# Patient Record
Sex: Female | Born: 1960 | Race: White | Hispanic: No | Marital: Married
Health system: Southern US, Community
[De-identification: ages and names within clinical notes are randomized; demographics above are authoritative.]

## PROBLEM LIST (undated history)

## (undated) DIAGNOSIS — F32A Depression, unspecified: Secondary | ICD-10-CM

## (undated) DIAGNOSIS — E039 Hypothyroidism, unspecified: Secondary | ICD-10-CM

## (undated) DIAGNOSIS — F329 Major depressive disorder, single episode, unspecified: Secondary | ICD-10-CM

---

## 2011-02-19 ENCOUNTER — Emergency Department (HOSPITAL_COMMUNITY)
Admission: EM | Admit: 2011-02-19 | Discharge: 2011-02-19 | Disposition: A | Discharge status: Home / Self Care | Payer: 59 | Attending: Emergency Medicine | Admitting: Emergency Medicine

## 2011-02-19 ENCOUNTER — Emergency Department (HOSPITAL_COMMUNITY): Payer: 59

## 2011-02-19 DIAGNOSIS — F329 Major depressive disorder, single episode, unspecified: Secondary | ICD-10-CM | POA: Insufficient documentation

## 2011-02-19 DIAGNOSIS — E039 Hypothyroidism, unspecified: Secondary | ICD-10-CM | POA: Insufficient documentation

## 2011-02-19 DIAGNOSIS — Z79899 Other long term (current) drug therapy: Secondary | ICD-10-CM | POA: Insufficient documentation

## 2011-02-19 DIAGNOSIS — J40 Bronchitis, not specified as acute or chronic: Secondary | ICD-10-CM | POA: Insufficient documentation

## 2011-02-19 DIAGNOSIS — F3289 Other specified depressive episodes: Secondary | ICD-10-CM | POA: Insufficient documentation

## 2011-02-19 HISTORY — DX: Major depressive disorder, single episode, unspecified: F32.9

## 2011-02-19 HISTORY — DX: Hypothyroidism, unspecified: E03.9

## 2011-02-19 HISTORY — DX: Depression, unspecified: F32.A

## 2011-02-19 MED ORDER — ALBUTEROL SULFATE HFA 108 (90 BASE) MCG/ACT IN AERS: 2.0000 | INHALATION_SPRAY | RESPIRATORY_TRACT | Status: AC | PRN

## 2011-02-19 MED ORDER — METHYLPREDNISOLONE 4 MG PO KIT: PACK | ORAL | Status: AC

## 2011-02-19 MED ORDER — IPRATROPIUM BROMIDE 0.02 % IN SOLN
0.5000 mg | Freq: Once | RESPIRATORY_TRACT | Status: AC
Start: 2011-02-19 — End: 2011-02-19
  Administered 2011-02-19: 0.5 mg via RESPIRATORY_TRACT
  Filled 2011-02-19: qty 2.5

## 2011-02-19 MED ORDER — GUAIFENESIN 100 MG/5ML PO LIQD: 100.0000 mg | ORAL | Status: AC | PRN

## 2011-02-19 MED ORDER — ALBUTEROL SULFATE (5 MG/ML) 0.5% IN NEBU
5.0000 mg | INHALATION_SOLUTION | Freq: Once | RESPIRATORY_TRACT | Status: AC
  Administered 2011-02-19: 5 mg via RESPIRATORY_TRACT
  Filled 2011-02-19: qty 1

## 2011-02-19 MED ORDER — AZITHROMYCIN 250 MG PO TABS: 250.0000 mg | ORAL_TABLET | Freq: Every day | ORAL | Status: AC

## 2011-02-19 NOTE — ED Notes (Signed)
Patient reports ongoing cough x 1 month-reports productive-yellow in color, no respiratory distress.

## 2011-02-19 NOTE — ED Provider Notes (Signed)
Medical screening examination/treatment/procedure(s) were performed by non-physician practitioner and as supervising physician I was immediately available for consultation/collaboration.  Doug Sou, MD 02/19/11 [phone]

## 2011-02-19 NOTE — ED Provider Notes (Signed)
History     [name]: [id number] Arrival date & time: 02/19/2011 10:53 AM   First MD Initiated Contact with Patient 02/19/11 1153      No chief complaint on file.   (Consider location/radiation/quality/duration/timing/severity/associated sxs/prior treatment) Patient is a 50 y.o. female presenting with cough. The history is provided by the patient.  Cough This is a new problem. The current episode started more than 1 week ago. The problem occurs constantly. The problem has not changed since onset.The cough is productive of sputum. There has been no fever. Pertinent negatives include no chest pain, no chills, no sweats, no weight loss, no ear congestion, no ear pain, no headaches, no rhinorrhea, no sore throat, no myalgias, no shortness of breath, no wheezing and no eye redness. She is not a smoker. Her past medical history does not include bronchitis, pneumonia, bronchiectasis, COPD, emphysema or asthma.   Patient presents with cough that started one month ago. She states this is productive in nature. She's been afebrile at home and denies chills. She denies abdominal pain, nausea, vomiting, diarrhea, leg swelling, chest pain. She's not had any shortness of breath. She does not have past medical history of asthma, chronic bronchitis, emphysema. She saw her primary care doctor in [place] for this and was prescribed a 2 week course of doxycycline. She states that she took about 10 days worth of this, but it did not help very much. She's not been taking any over-the-counter cough or cold medications. She states that she has felt especially poorly over the past 4 days, which prompted her visit today.  Past Medical History  Diagnosis Date  . Depression   . Hypothyroid     No past surgical history on file.  No family history on file.  History  Substance Use Topics  . Smoking status: Not on file  . Smokeless tobacco: Not on file  . Alcohol Use:     OB History    Grav Para Term Preterm  Abortions TAB SAB Ect Mult Living                  Review of Systems  Constitutional: Negative for fever, chills, weight loss, activity change, appetite change, fatigue and unexpected weight change.  HENT: Negative for ear pain, sore throat, facial swelling, rhinorrhea, neck pain and neck stiffness.   Eyes: Negative for redness.  Respiratory: Positive for cough. Negative for shortness of breath and wheezing.   Cardiovascular: Negative for chest pain.  Gastrointestinal: Negative.   Genitourinary: Negative.   Musculoskeletal: Negative for myalgias.  Neurological: Negative for dizziness, light-headedness and headaches.    Allergies  Review of patient's allergies indicates no known allergies.  Home Medications   Current Outpatient Rx  Name Route Sig Dispense Refill  . ALPRAZOLAM 1 MG PO TABS Oral Take 1 mg by mouth 3 (three) times daily as needed. For anxiety.     Marland Kitchen DIVALPROEX SODIUM 250 MG PO TBEC Oral Take 250 mg by mouth 3 (three) times daily.      Marland Kitchen DOXYCYCLINE HYCLATE 100 MG PO TABS Oral Take 100 mg by mouth 2 (two) times daily. 14 day course of therapy, finished.     . IBUPROFEN 200 MG PO TABS Oral Take 400-800 mg by mouth every 8 (eight) hours as needed. For pain.     Marland Kitchen LEVOTHYROXINE SODIUM 112 MCG PO TABS Oral Take 112 mcg by mouth daily.      . RED YEAST RICE PO Oral Take 2 tablets by mouth 2 (  two) times daily.      . SUMATRIPTAN SUCCINATE 50 MG PO TABS Oral Take 50 mg by mouth every 2 (two) hours as needed. For migraine.     Marland Kitchen VILAZODONE HCL 40 MG PO TABS Oral Take 1 tablet by mouth daily.        BP 127/76  Pulse 92  Temp(Src) 98.5 F (36.9 C) (Oral)  Resp 18  SpO2 96%  Physical Exam  Nursing note and vitals reviewed. Constitutional: She is oriented to person, place, and time. She appears well-developed and well-nourished. No distress.  HENT:  Head: Normocephalic and atraumatic.  Right Ear: External ear normal.  Left Ear: External ear normal.  Eyes: Conjunctivae  are normal. Pupils are equal, round, and reactive to light.  Neck: Normal range of motion.  Cardiovascular: Normal rate, regular rhythm and normal heart sounds.   Pulmonary/Chest: Effort normal. No respiratory distress. She has wheezes. She has no rales. She exhibits no tenderness.       Mild expiratory wheezing diffusely  Abdominal: Soft. Bowel sounds are normal. There is no tenderness.  Musculoskeletal: Normal range of motion. She exhibits no edema.  Neurological: She is alert and oriented to person, place, and time. No cranial nerve deficit.  Skin: Skin is warm and dry. No rash noted. She is not diaphoretic.  Psychiatric: She has a normal mood and affect.    ED Course  Procedures (including critical care time)  Labs Reviewed - No data to display Dg Chest 2 View  02/19/2011  *RADIOLOGY REPORT*  Clinical Data: Chest pain and cough for 1 month.  CHEST - 2 VIEW  Comparison: None.  Findings: Two views of the chest demonstrate clear lungs. Heart and mediastinum are within normal limits.  The trachea is midline. Bony structures are intact.  IMPRESSION: Normal chest examination.  Original Report Authenticated By: Richarda Overlie, M.D.     1. Bronchitis       MDM  Xray reviewed by myself and appears to be unremarkable. No sign of PNA. I think this is likely viral despite the patient having sx for a month. Will treat with Medrol dose-pack and albuterol inhaler. Patient was instructed to return for worsening SOB, fever, or other worrisome symptoms. She verbalized understanding and agreed to plan.        Grant Fontana, [place] 02/19/11 1541

## 2013-01-15 IMAGING — CR DG CHEST 2V
2 series · 2 of 2 positions shown · non-contrast
Comparison: None.

CLINICAL DATA: Chest pain and cough for 1 month.

CHEST - 2 VIEW

[w chest pa]
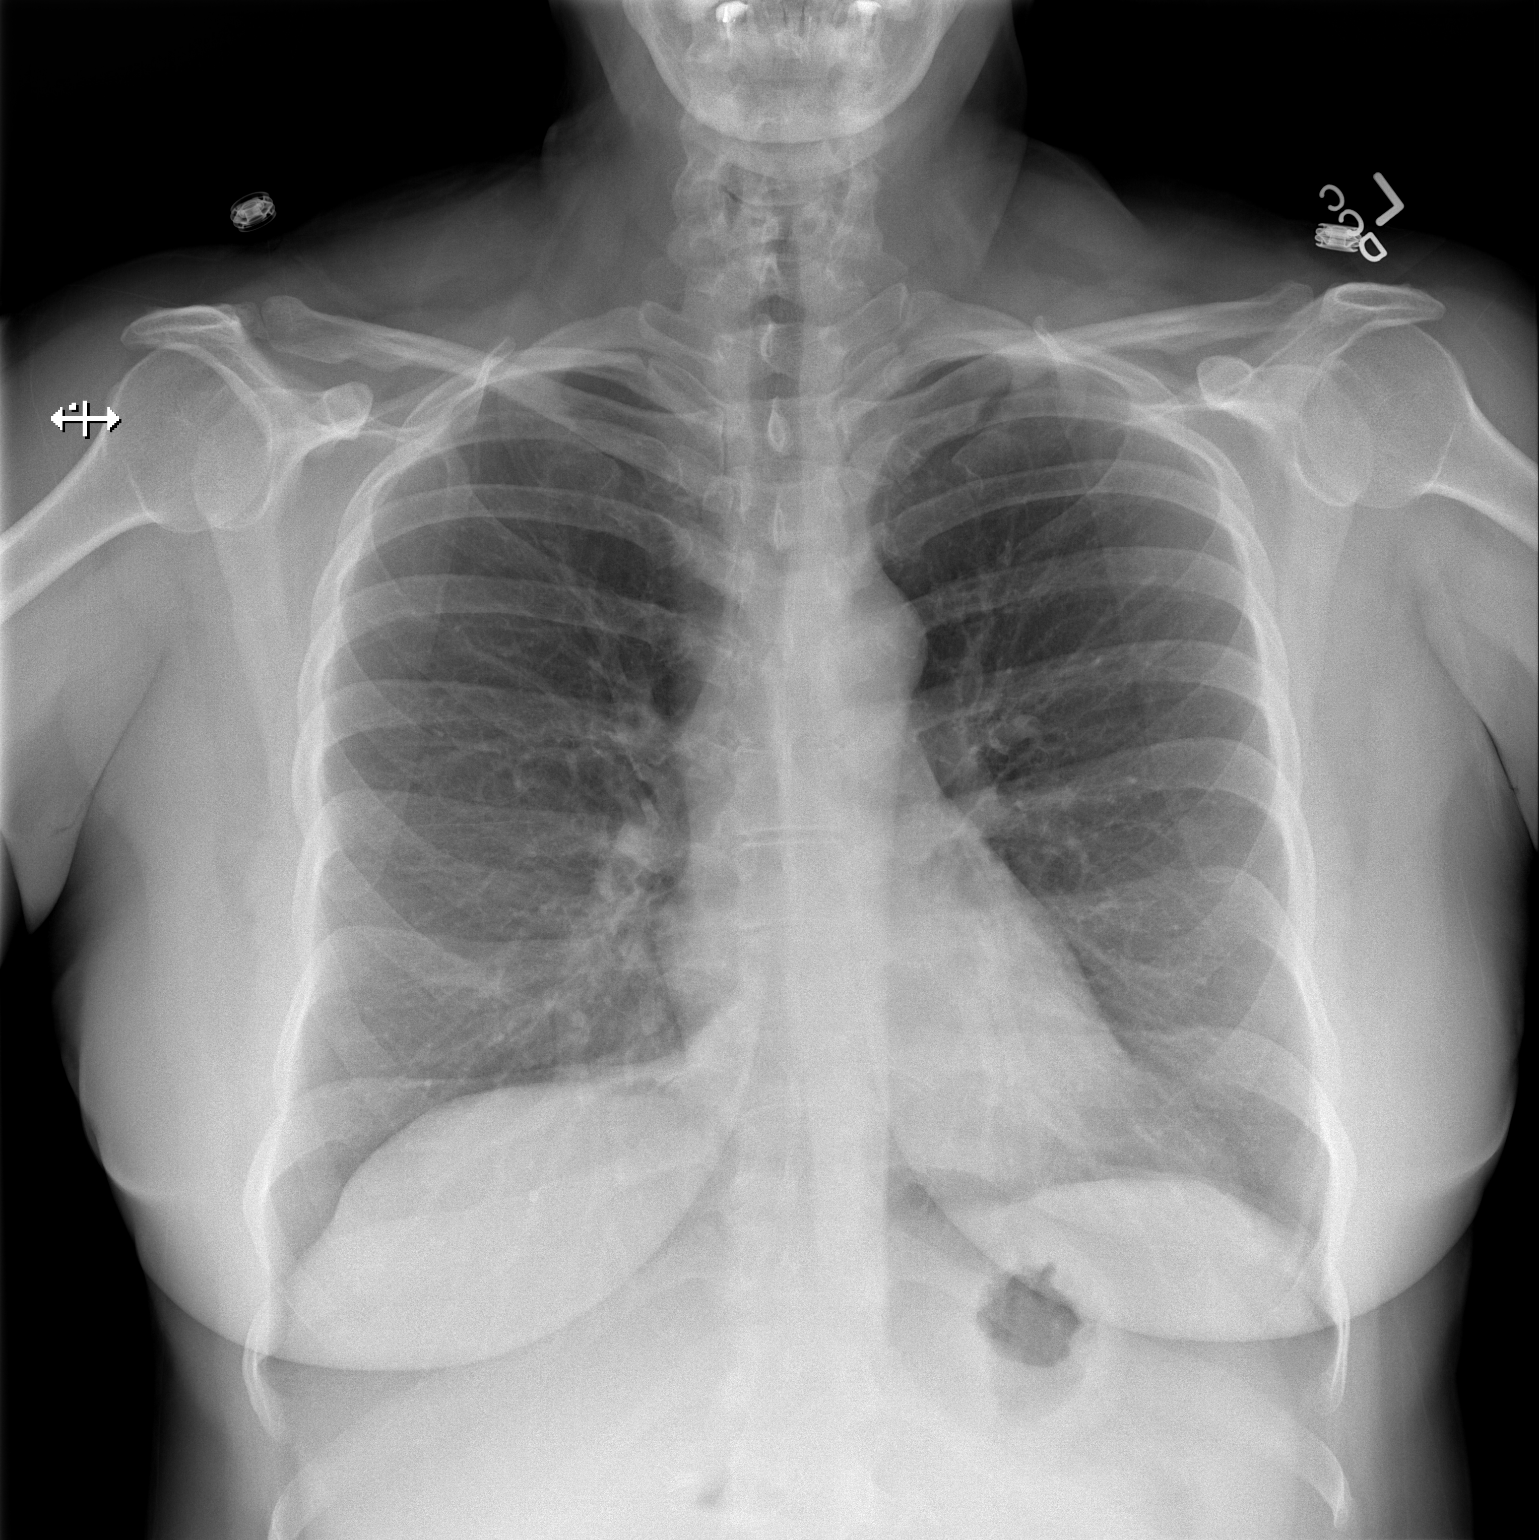

[w chest lat]
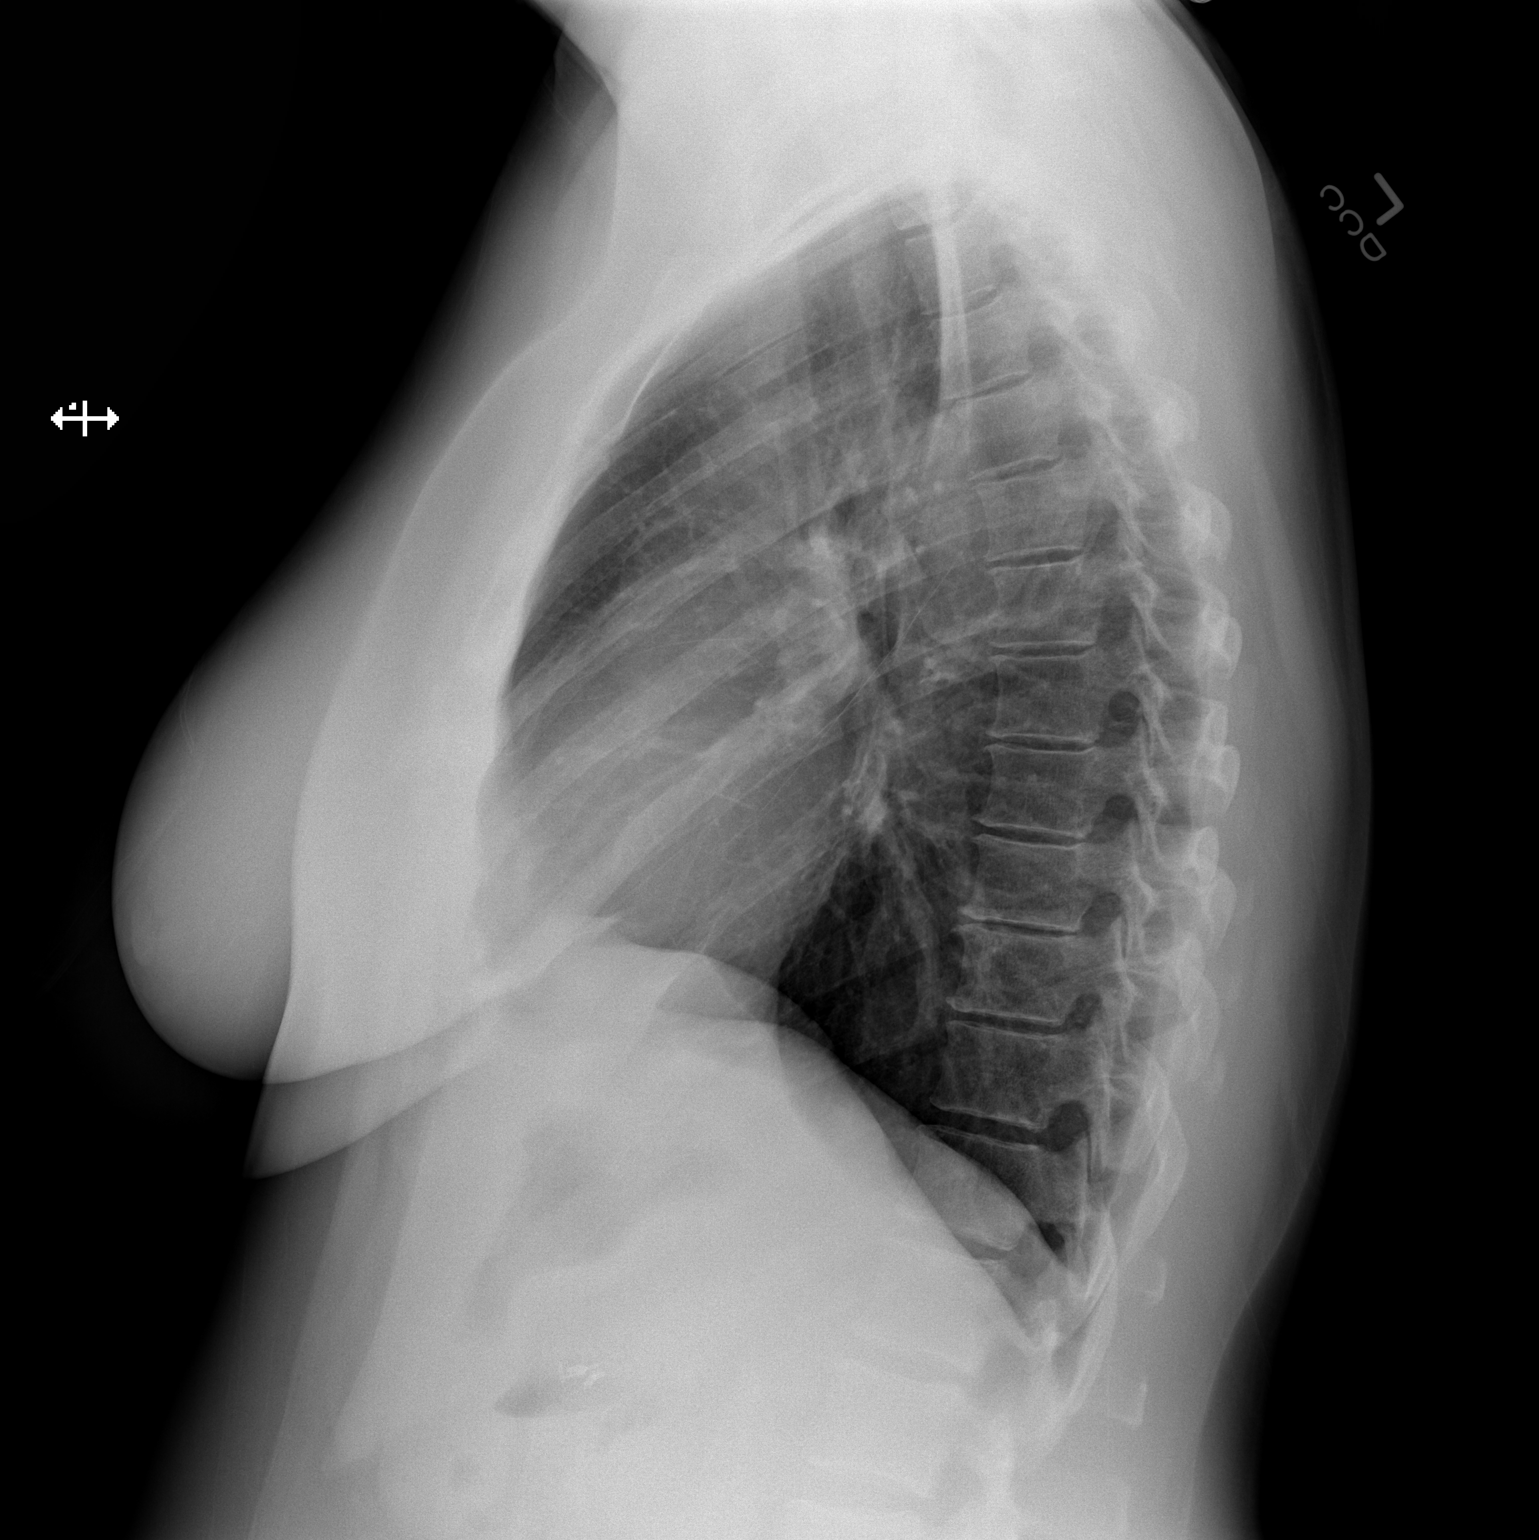

[2 of 2 positions shown; findings below may reference images not displayed]

FINDINGS: Two views of the chest demonstrate clear lungs. Heart and
mediastinum are within normal limits.  The trachea is midline.
Bony structures are intact.
IMPRESSION: Normal chest examination.

## 2017-04-23 DIAGNOSIS — Z789 Other specified health status: Secondary | ICD-10-CM | POA: Diagnosis not present

## 2017-04-23 DIAGNOSIS — I129 Hypertensive chronic kidney disease with stage 1 through stage 4 chronic kidney disease, or unspecified chronic kidney disease: Secondary | ICD-10-CM | POA: Diagnosis not present

## 2017-04-23 DIAGNOSIS — E7800 Pure hypercholesterolemia, unspecified: Secondary | ICD-10-CM | POA: Diagnosis not present

## 2017-04-23 DIAGNOSIS — E039 Hypothyroidism, unspecified: Secondary | ICD-10-CM | POA: Diagnosis not present

## 2017-04-23 DIAGNOSIS — N182 Chronic kidney disease, stage 2 (mild): Secondary | ICD-10-CM | POA: Diagnosis not present

## 2017-10-20 DIAGNOSIS — E039 Hypothyroidism, unspecified: Secondary | ICD-10-CM | POA: Diagnosis not present

## 2017-10-20 DIAGNOSIS — I10 Essential (primary) hypertension: Secondary | ICD-10-CM | POA: Diagnosis not present

## 2017-10-20 DIAGNOSIS — F3341 Major depressive disorder, recurrent, in partial remission: Secondary | ICD-10-CM | POA: Diagnosis not present

## 2017-10-20 DIAGNOSIS — F411 Generalized anxiety disorder: Secondary | ICD-10-CM | POA: Diagnosis not present

## 2017-10-20 DIAGNOSIS — E7800 Pure hypercholesterolemia, unspecified: Secondary | ICD-10-CM | POA: Diagnosis not present

## 2018-05-30 DIAGNOSIS — J039 Acute tonsillitis, unspecified: Secondary | ICD-10-CM | POA: Diagnosis not present

## 2018-06-05 DIAGNOSIS — J039 Acute tonsillitis, unspecified: Secondary | ICD-10-CM | POA: Diagnosis not present

## 2018-08-27 DIAGNOSIS — F411 Generalized anxiety disorder: Secondary | ICD-10-CM | POA: Diagnosis not present

## 2018-11-25 DIAGNOSIS — E039 Hypothyroidism, unspecified: Secondary | ICD-10-CM | POA: Diagnosis not present

## 2018-12-24 DIAGNOSIS — N644 Mastodynia: Secondary | ICD-10-CM | POA: Diagnosis not present

## 2018-12-31 DIAGNOSIS — Z1151 Encounter for screening for human papillomavirus (HPV): Secondary | ICD-10-CM | POA: Diagnosis not present

## 2018-12-31 DIAGNOSIS — Z6832 Body mass index (BMI) 32.0-32.9, adult: Secondary | ICD-10-CM | POA: Diagnosis not present

## 2018-12-31 DIAGNOSIS — Z01419 Encounter for gynecological examination (general) (routine) without abnormal findings: Secondary | ICD-10-CM | POA: Diagnosis not present

## 2019-01-04 DIAGNOSIS — E039 Hypothyroidism, unspecified: Secondary | ICD-10-CM | POA: Diagnosis not present

## 2019-01-25 DIAGNOSIS — N841 Polyp of cervix uteri: Secondary | ICD-10-CM | POA: Diagnosis not present

## 2019-01-25 DIAGNOSIS — Z1231 Encounter for screening mammogram for malignant neoplasm of breast: Secondary | ICD-10-CM | POA: Diagnosis not present

## 2019-03-14 DIAGNOSIS — G43109 Migraine with aura, not intractable, without status migrainosus: Secondary | ICD-10-CM | POA: Diagnosis not present

## 2019-03-14 DIAGNOSIS — F3341 Major depressive disorder, recurrent, in partial remission: Secondary | ICD-10-CM | POA: Diagnosis not present

## 2019-09-12 DIAGNOSIS — E039 Hypothyroidism, unspecified: Secondary | ICD-10-CM | POA: Diagnosis not present

## 2019-09-12 DIAGNOSIS — Z6832 Body mass index (BMI) 32.0-32.9, adult: Secondary | ICD-10-CM | POA: Diagnosis not present

## 2019-09-12 DIAGNOSIS — F411 Generalized anxiety disorder: Secondary | ICD-10-CM | POA: Diagnosis not present

## 2021-12-12 ENCOUNTER — Emergency Department (HOSPITAL_BASED_OUTPATIENT_CLINIC_OR_DEPARTMENT_OTHER)
Admission: EM | Admit: 2021-12-12 | Discharge: 2021-12-12 | Disposition: A | Discharge status: Home / Self Care | Payer: BC Managed Care – PPO | Attending: Emergency Medicine | Admitting: Emergency Medicine

## 2021-12-12 ENCOUNTER — Other Ambulatory Visit: Payer: Self-pay

## 2021-12-12 ENCOUNTER — Encounter (HOSPITAL_BASED_OUTPATIENT_CLINIC_OR_DEPARTMENT_OTHER): Payer: Self-pay | Admitting: Emergency Medicine

## 2021-12-12 DIAGNOSIS — R251 Tremor, unspecified: Secondary | ICD-10-CM | POA: Diagnosis present

## 2021-12-12 DIAGNOSIS — F515 Nightmare disorder: Secondary | ICD-10-CM | POA: Insufficient documentation

## 2021-12-12 DIAGNOSIS — F419 Anxiety disorder, unspecified: Secondary | ICD-10-CM | POA: Insufficient documentation

## 2021-12-12 LAB — TSH: TSH: 1.418 u[IU]/mL (ref 0.350–4.500)

## 2021-12-12 LAB — COMPREHENSIVE METABOLIC PANEL
ALT: 23 U/L (ref 0–44)
AST: 19 U/L (ref 15–41)
Albumin: 4.2 g/dL (ref 3.5–5.0)
Alkaline Phosphatase: 61 U/L (ref 38–126)
Anion gap: 10 (ref 5–15)
BUN: 13 mg/dL (ref 8–23)
CO2: 24 mmol/L (ref 22–32)
Calcium: 10.1 mg/dL (ref 8.9–10.3)
Chloride: 102 mmol/L (ref 98–111)
Creatinine, Ser: 0.79 mg/dL (ref 0.44–1.00)
GFR, Estimated: >60 mL/min (ref >60)
Glucose, Bld: 148 mg/dL — ABNORMAL HIGH (ref 70–99)
Potassium: 3.7 mmol/L (ref 3.5–5.1)
Sodium: 136 mmol/L (ref 135–145)
Total Bilirubin: 0.9 mg/dL (ref 0.3–1.2)
Total Protein: 7.2 g/dL (ref 6.5–8.1)

## 2021-12-12 LAB — CBC WITH DIFFERENTIAL/PLATELET
Abs Immature Granulocytes: 0.04 10*3/uL (ref 0.00–0.07)
Basophils Absolute: 0 10*3/uL (ref 0.0–0.1)
Basophils Relative: 0 %
Eosinophils Absolute: 0 10*3/uL (ref 0.0–0.5)
Eosinophils Relative: 0 %
HCT: 44.3 % (ref 36.0–46.0)
Hemoglobin: 15 g/dL (ref 12.0–15.0)
Immature Granulocytes: 0 %
Lymphocytes Relative: 13 %
Lymphs Abs: 1.3 10*3/uL (ref 0.7–4.0)
MCH: 29.2 pg (ref 26.0–34.0)
MCHC: 33.9 g/dL (ref 30.0–36.0)
MCV: 86.2 fL (ref 80.0–100.0)
Monocytes Absolute: 0.7 10*3/uL (ref 0.1–1.0)
Monocytes Relative: 7 %
Neutro Abs: 7.7 10*3/uL (ref 1.7–7.7)
Neutrophils Relative %: 80 %
Platelets: 245 10*3/uL (ref 150–400)
RBC: 5.14 MIL/uL — ABNORMAL HIGH (ref 3.87–5.11)
RDW: 12.7 % (ref 11.5–15.5)
WBC: 9.8 10*3/uL (ref 4.0–10.5)
nRBC: 0 % (ref 0.0–0.2)

## 2021-12-12 LAB — URINALYSIS, ROUTINE W REFLEX MICROSCOPIC
Bilirubin Urine: NEGATIVE
Glucose, UA: NEGATIVE mg/dL
Ketones, ur: NEGATIVE mg/dL
Leukocytes,Ua: NEGATIVE
Nitrite: NEGATIVE
Protein, ur: NEGATIVE mg/dL
Specific Gravity, Urine: <=1.005 (ref 1.005–1.030)
pH: 6.5 (ref 5.0–8.0)

## 2021-12-12 LAB — URINALYSIS, MICROSCOPIC (REFLEX)

## 2021-12-12 LAB — TROPONIN I (HIGH SENSITIVITY): Troponin I (High Sensitivity): <2 ng/L (ref <18)

## 2021-12-12 MED ORDER — ALPRAZOLAM 1 MG PO TABS: 1.0000 mg | ORAL_TABLET | Freq: Three times a day (TID) | ORAL | 0 refills | Status: AC | PRN

## 2021-12-12 MED ORDER — ALPRAZOLAM 0.5 MG PO TABS
0.5000 mg | ORAL_TABLET | Freq: Once | ORAL | Status: AC
  Administered 2021-12-12: 0.5 mg via ORAL
  Filled 2021-12-12: qty 1

## 2021-12-12 NOTE — ED Triage Notes (Signed)
Saw dr on Tuesday for same shakes ,  and anxiety, given rx and tried them ,, had bad dreams  lexapro and buspar  has made things worse , no appetiti  bad dreams ,  she states meds not helpings and needs something else

## 2021-12-12 NOTE — Discharge Instructions (Addendum)
You were seen in the emergency department for increased anxiety tremulousness.  Your lab work did not show an obvious explanation for your symptoms, although your thyroid test is still pending at time of discharge.  You can follow this result up in MyChart.  We are prescribing you a short course of Xanax that may help with your symptoms.  Please follow-up with your primary care doctor as scheduled.  Return to the emergency department if any worsening or concerning symptoms.

## 2021-12-12 NOTE — ED Provider Notes (Signed)
[facility] EMERGENCY DEPARTMENT Provider Note   [name]: [id number] Arrival date & time: 12/12/21  9562     History  Chief Complaint  Patient presents with   Panic Attack    Lamiyah Taylor is a 61 y.o. female.  She has a history of depression and anxiety.  She had been off meds for about a year.  She had more depressive anxiety symptoms recently and so saw her PCP who started her back on Lexapro and started buspirone.  This was 2 days ago.  Since then she has had increased anxiety and nightmares, feeling tremulous.  She feels it might be a low blood sugar so she has been increasing her sugar intake without improvement.  She said she has not taken her Xanax in over 6 years.  She is supposed to get blood work done next week by her PCP but felt she needed to come here to check blood work to make sure there was nothing significantly wrong.  The history is provided by the patient and a friend.  Anxiety This is a recurrent problem. The current episode started 2 days ago. The problem occurs constantly. The problem has not changed since onset.Pertinent negatives include no chest pain, no abdominal pain, no headaches and no shortness of breath. Exacerbated by: Question new medication. Nothing relieves the symptoms. She has tried rest for the symptoms. The treatment provided no relief.       Home Medications Prior to Admission medications   Medication Sig Start Date End Date Taking? Authorizing Provider  albuterol (PROVENTIL HFA;VENTOLIN HFA) 108 (90 BASE) MCG/ACT inhaler Inhale 2 puffs into the lungs every 4 (four) hours as needed for wheezing. 02/19/11 02/19/12  Grant Fontana, PA-C  ALPRAZolam Prudy Feeler) 1 MG tablet Take 1 mg by mouth 3 (three) times daily as needed. For anxiety.     [provider]  divalproex (DEPAKOTE) 250 MG DR tablet Take 250 mg by mouth 3 (three) times daily.      [provider]  doxycycline (VIBRA-TABS) 100 MG tablet Take 100 mg by mouth 2  (two) times daily. 14 day course of therapy, finished.     [provider]  ibuprofen (ADVIL,MOTRIN) 200 MG tablet Take 400-800 mg by mouth every 8 (eight) hours as needed. For pain.     [provider]  levothyroxine (SYNTHROID, LEVOTHROID) 112 MCG tablet Take 112 mcg by mouth daily.      [provider]  methylPREDNISolone (MEDROL DOSEPAK) 4 MG tablet 6 PO first day, 5 second day, 4 third day, 3 fourth day, 2 fifth day, 1 sixth day 02/19/11   Grant Fontana, PA-C  Red Yeast Rice Extract (RED YEAST RICE PO) Take 2 tablets by mouth 2 (two) times daily.      [provider]  SUMAtriptan (IMITREX) 50 MG tablet Take 50 mg by mouth every 2 (two) hours as needed. For migraine.     [provider]  Vilazodone HCl (VIIBRYD) 40 MG TABS Take 1 tablet by mouth daily.      [provider]      Allergies    Patient has no known allergies.    Review of Systems   Review of Systems  Constitutional:  Negative for fever.  HENT:  Negative for sore throat.   Eyes:  Negative for visual disturbance.  Respiratory:  Negative for shortness of breath.   Cardiovascular:  Negative for chest pain.  Gastrointestinal:  Negative for abdominal pain.  Genitourinary:  Negative for dysuria.  Skin:  Negative for rash.  Neurological:  Positive for tremors. Negative for headaches.  Psychiatric/Behavioral:  Positive for sleep disturbance. The patient is nervous/anxious.     Physical Exam Updated Vital Signs BP (!) 165/93   Pulse 88   Temp 98.1 F (36.7 C) (Oral)   Resp 20   Ht 5\' 3"  (1.6 m)   Wt 74.8 kg   SpO2 100%   BMI 29.23 kg/m  Physical Exam Vitals and nursing note reviewed.  Constitutional:      General: She is not in acute distress.    Appearance: Normal appearance. She is well-developed.  HENT:     Head: Normocephalic and atraumatic.  Eyes:     Conjunctiva/sclera: Conjunctivae normal.  Neck:     Comments: No thyroid enlargement or  tenderness Cardiovascular:     Rate and Rhythm: Normal rate and regular rhythm.     Heart sounds: No murmur heard. Pulmonary:     Effort: Pulmonary effort is normal. No respiratory distress.     Breath sounds: Normal breath sounds.  Abdominal:     Palpations: Abdomen is soft.     Tenderness: There is no abdominal tenderness. There is no guarding or rebound.  Musculoskeletal:        General: No swelling.     Cervical back: Neck supple.  Skin:    General: Skin is warm and dry.     Capillary Refill: Capillary refill takes less than 2 seconds.  Neurological:     General: No focal deficit present.     Mental Status: She is alert.     Motor: No weakness.     Gait: Gait normal.     ED Results / Procedures / Treatments   Labs (all labs ordered are listed, but only abnormal results are displayed) Labs Reviewed  COMPREHENSIVE METABOLIC PANEL - Abnormal; Notable for the following components:      Result Value   Glucose, Bld 148 (*)    All other components within normal limits  CBC WITH DIFFERENTIAL/PLATELET - Abnormal; Notable for the following components:   RBC 5.14 (*)    All other components within normal limits  URINALYSIS, ROUTINE W REFLEX MICROSCOPIC - Abnormal; Notable for the following components:   Hgb urine dipstick MODERATE (*)    All other components within normal limits  URINALYSIS, MICROSCOPIC (REFLEX) - Abnormal; Notable for the following components:   Bacteria, UA RARE (*)    All other components within normal limits  TSH  TROPONIN I (HIGH SENSITIVITY)    EKG EKG Interpretation  Date/Time:  Thursday December 12 2021 07:13:53 EDT Ventricular Rate:  79 PR Interval:  176 QRS Duration: 86 QT Interval:  365 QTC Calculation: 419 R Axis:   35 Text Interpretation: Sinus rhythm Low voltage, precordial leads RSR' in V1 or V2, right VCD or RVH Borderline T abnormalities, anterior leads No old tracing to compare Confirmed by [id number] [phone]) on 12/12/2021  7:18:58 AM  Radiology No results found.  Procedures Procedures    Medications Ordered in ED Medications  ALPRAZolam (XANAX) tablet 0.5 mg (has no administration in time range)    ED Course/ Medical Decision Making/ A&P                           Medical Decision Making Amount and/or Complexity of Data Reviewed Labs: ordered.  Risk Prescription drug management.  This patient complains of increased anxiety symptoms, tremor, nightmares; this involves an extensive number  of treatment Options and is a complaint that carries with it a high risk of complications and morbidity. The differential includes medication side effect, psychiatric illness, anxiety, hyperthyroidism, metabolic derangement  I ordered, reviewed and interpreted labs, which included labs fairly unremarkable other than elevated glucose nonfasting.  TSH pending at time of discharge I ordered medication Xanax with some improvement in her symptoms and reviewed PMP when indicated. Additional history obtained from patient's companion Previous records obtained and reviewed in epic no recent admissions  Cardiac monitoring reviewed, normal sinus rhythm Social determinants considered, no significant barriers Critical Interventions: None  After the interventions stated above, I reevaluated the patient and found patient to be slightly more calm appearing Admission and further testing considered, no indications for admission or further work-up at this time.  Patient will stop the BuSpar and continue her Lexapro and follow-up with her primary care doctor on Monday as scheduled.  Given short prescription of Xanax for as needed use.  Return instructions discussed.          Final Clinical Impression(s) / ED Diagnoses Final diagnoses:  Tremulousness    Rx / DC Orders ED Discharge Orders          Ordered    ALPRAZolam (XANAX) 1 MG tablet  3 times daily PRN        12/12/21 0917              Terrilee Files,  MD 12/12/21 [phone]
